# Patient Record
Sex: Female | Born: 1960 | Race: Black or African American | Hispanic: No | Marital: Single | Smoking: Current every day smoker
Health system: Southern US, Community
[De-identification: ages and names within clinical notes are randomized; demographics above are authoritative.]

---

## 1998-04-01 ENCOUNTER — Encounter: Payer: Self-pay | Admitting: *Deleted

## 1998-04-01 ENCOUNTER — Inpatient Hospital Stay (HOSPITAL_COMMUNITY): Admission: AD | Admit: 1998-04-01 | Discharge: 1998-04-01 | Payer: Self-pay | Admitting: *Deleted

## 2002-10-07 ENCOUNTER — Encounter: Payer: Self-pay | Admitting: Obstetrics & Gynecology

## 2002-10-07 ENCOUNTER — Encounter: Admission: RE | Admit: 2002-10-07 | Discharge: 2002-10-07 | Payer: Self-pay | Admitting: Obstetrics & Gynecology

## 2002-12-10 ENCOUNTER — Ambulatory Visit (HOSPITAL_COMMUNITY): Admission: RE | Admit: 2002-12-10 | Discharge: 2002-12-10 | Payer: Self-pay | Admitting: Obstetrics & Gynecology

## 2002-12-10 ENCOUNTER — Encounter: Payer: Self-pay | Admitting: Obstetrics & Gynecology

## 2008-08-28 ENCOUNTER — Emergency Department (HOSPITAL_COMMUNITY): Admission: EM | Admit: 2008-08-28 | Discharge: 2008-08-28 | Payer: Self-pay | Admitting: Emergency Medicine

## 2008-09-03 ENCOUNTER — Emergency Department (HOSPITAL_COMMUNITY): Admission: EM | Admit: 2008-09-03 | Discharge: 2008-09-03 | Payer: Self-pay | Admitting: Emergency Medicine

## 2010-07-17 ENCOUNTER — Encounter: Payer: Self-pay | Admitting: Obstetrics & Gynecology

## 2019-04-06 ENCOUNTER — Other Ambulatory Visit: Payer: Self-pay

## 2019-04-06 DIAGNOSIS — Z20822 Contact with and (suspected) exposure to covid-19: Secondary | ICD-10-CM

## 2019-04-07 LAB — NOVEL CORONAVIRUS, NAA: SARS-CoV-2, NAA: NOT DETECTED

## 2020-02-24 ENCOUNTER — Other Ambulatory Visit: Payer: Self-pay

## 2020-02-24 ENCOUNTER — Ambulatory Visit
Admission: EM | Admit: 2020-02-24 | Discharge: 2020-02-24 | Disposition: A | Discharge status: Home / Self Care | Payer: Self-pay | Attending: Emergency Medicine | Admitting: Emergency Medicine

## 2020-02-24 DIAGNOSIS — Z1152 Encounter for screening for COVID-19: Secondary | ICD-10-CM

## 2020-02-24 NOTE — Discharge Instructions (Signed)

## 2020-02-24 NOTE — ED Triage Notes (Signed)
Pt states had a covid exposure last week. Denies sx's

## 2020-02-27 LAB — NOVEL CORONAVIRUS, NAA: SARS-CoV-2, NAA: NOT DETECTED

## 2020-06-20 ENCOUNTER — Emergency Department (HOSPITAL_COMMUNITY): Payer: Self-pay

## 2020-06-20 ENCOUNTER — Emergency Department (HOSPITAL_COMMUNITY)
Admission: EM | Admit: 2020-06-20 | Discharge: 2020-06-21 | Disposition: A | Discharge status: Home / Self Care | Payer: Self-pay | Attending: Emergency Medicine | Admitting: Emergency Medicine

## 2020-06-20 ENCOUNTER — Other Ambulatory Visit: Payer: Self-pay

## 2020-06-20 DIAGNOSIS — M79662 Pain in left lower leg: Secondary | ICD-10-CM | POA: Diagnosis not present

## 2020-06-20 DIAGNOSIS — Z5321 Procedure and treatment not carried out due to patient leaving prior to being seen by health care provider: Secondary | ICD-10-CM | POA: Diagnosis not present

## 2020-06-20 DIAGNOSIS — M25551 Pain in right hip: Secondary | ICD-10-CM | POA: Diagnosis not present
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
## 2020-06-21 ENCOUNTER — Ambulatory Visit (HOSPITAL_COMMUNITY): Admit: 2020-06-21 | Discharge status: Against Medical Advice | Payer: Self-pay

## 2020-06-21 ENCOUNTER — Encounter: Payer: Self-pay | Admitting: Emergency Medicine

## 2020-06-21 ENCOUNTER — Ambulatory Visit
Admission: EM | Admit: 2020-06-21 | Discharge: 2020-06-21 | Disposition: A | Discharge status: Home / Self Care | Payer: No Typology Code available for payment source | Attending: Emergency Medicine | Admitting: Emergency Medicine

## 2020-06-21 DIAGNOSIS — M7918 Myalgia, other site: Secondary | ICD-10-CM

## 2020-06-21 MED ORDER — NAPROXEN 500 MG PO TABS: 500.0000 mg | ORAL_TABLET | Freq: Two times a day (BID) | ORAL | 0 refills | Status: DC

## 2020-06-21 MED ORDER — CYCLOBENZAPRINE HCL 5 MG PO TABS: 5.0000 mg | ORAL_TABLET | Freq: Two times a day (BID) | ORAL | 0 refills | Status: AC | PRN
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
## 2020-06-21 NOTE — ED Notes (Signed)
Pt checked out AMA. 

## 2020-08-21 ENCOUNTER — Ambulatory Visit
Admission: EM | Admit: 2020-08-21 | Discharge: 2020-08-21 | Disposition: A | Discharge status: Home / Self Care | Payer: Self-pay | Attending: Emergency Medicine | Admitting: Emergency Medicine

## 2020-08-21 ENCOUNTER — Encounter: Payer: Self-pay | Admitting: *Deleted

## 2020-08-21 ENCOUNTER — Other Ambulatory Visit: Payer: Self-pay

## 2020-08-21 DIAGNOSIS — R030 Elevated blood-pressure reading, without diagnosis of hypertension: Secondary | ICD-10-CM

## 2020-08-21 DIAGNOSIS — L089 Local infection of the skin and subcutaneous tissue, unspecified: Secondary | ICD-10-CM

## 2020-08-21 MED ORDER — TRIAMCINOLONE ACETONIDE 0.1 % EX CREA: 1.0000 "application " | TOPICAL_CREAM | Freq: Two times a day (BID) | CUTANEOUS | 0 refills | Status: AC

## 2020-08-21 MED ORDER — CEPHALEXIN 500 MG PO CAPS: 1000.0000 mg | ORAL_CAPSULE | Freq: Two times a day (BID) | ORAL | 0 refills | Status: AC

## 2020-08-21 MED ORDER — IBUPROFEN 600 MG PO TABS: 600.0000 mg | ORAL_TABLET | Freq: Four times a day (QID) | ORAL | 0 refills | Status: AC | PRN

## 2020-08-21 NOTE — ED Provider Notes (Addendum)
HPI  SUBJECTIVE:  Kendra Glover is a 60 y.o. female who presents with a painful, pruritic, swelling above her right lateral eyebrow for the past 3 days.  She denies trauma, known insect bite, facial rash, eye pain, pain with extraocular movements, visual changes, ear pain, other facial pain.  No fevers.  Reports mild diffuse headache.  No antipyretic in the past 6 hours.  She tried warm compresses with worsening of her symptoms.  No alleviating factors.  She has a past medical history of varicella.  No history of MRSA, abscess, diabetes, hypertension, chronic kidney disease.  PMD: None.  History reviewed. No pertinent past medical history.  History reviewed. No pertinent surgical history.  Family History  Problem Relation Age of Onset  . Hypertension Mother   . Hypertension Father   . Kidney disease Father     Social History   Tobacco Use  . Smoking status: Current Every Day Smoker  . Smokeless tobacco: Never Used  Vaping Use  . Vaping Use: Never used  Substance Use Topics  . Alcohol use: Yes    Comment: occasionally  . Drug use: Never    No current facility-administered medications for this encounter.  Current Outpatient Medications:  .  cephALEXin (KEFLEX) 500 MG capsule, Take 2 capsules (1,000 mg total) by mouth 2 (two) times daily for 5 days., Disp: 20 capsule, Rfl: 0 .  ibuprofen (ADVIL) 600 MG tablet, Take 1 tablet (600 mg total) by mouth every 6 (six) hours as needed., Disp: 30 tablet, Rfl: 0 .  triamcinolone (KENALOG) 0.1 %, Apply 1 application topically 2 (two) times daily. Apply for 2 weeks. May use on face, Disp: 30 g, Rfl: 0  No Known Allergies   ROS  As noted in HPI.   Physical Exam  BP (!) 174/103   Pulse 74   Temp (!) 97.3 F (36.3 C) (Temporal)   Resp 16   SpO2 98%    BP Readings from Last 3 Encounters:  08/21/20 (!) 174/103  06/21/20 (!) 168/79  06/21/20 (!) 163/84    Constitutional: Well developed, well nourished, no acute distress Eyes:   EOMI, conjunctiva normal bilaterally PERRLA, no direct or consensual photophobia HENT: Normocephalic, atraumatic,mucus membranes moist. positive 2 x 2 tender mobile mass superior right lateral eyebrow.  Questionable vesicles centrally.  No fluctuance.  No other facial, nose rash.  Right EAC normal.  No periorbital erythema, edema.  Right upper and lower eyelid normal       Respiratory: Normal inspiratory effort Cardiovascular: Normal rate GI: nondistended skin: No rash, skin intact Musculoskeletal: no deformities Neurologic: Alert & oriented x 3, no focal neuro deficits Psychiatric: Speech and behavior appropriate   ED Course   Medications - No data to display  No orders of the defined types were placed in this encounter.   No results found for this or any previous visit (from the past 24 hour(s)). No results found.  ED Clinical Impression  1. Facial infection   2. Elevated blood-pressure reading without diagnosis of hypertension      ED Assessment/Plan  In the differential is infected insect bite versus infected or inflamed gland since it is a mobile mass.  Early shingles is in the differential.  Will send home with Keflex for 5 days because it does not appear to be purulent, warm compress, Tylenol/ibuprofen, triamcinolone cream, Claritin or Zyrtec for the itching.  Return here if vesicular rash shows up.  Will provide primary care list and order assistance to help her  find of PMD.  Work note for 2 days.  Blood pressure noted.  It has been elevated on the past 3 visits.she does not take her blood pressure at home.  Has no idea what it runs normally.  Pt has no historical evidence of end organ damage. Pt denies any CNS type sx such as HA, visual changes, focal paresis, or new onset seizure activity. Pt denies any CV sx such as CP, dyspnea, palpitations, pedal edema, tearing pain radiating to back or abd. Pt denied any renal sx such as anuria or hematuria.   will have her buy a  blood pressure cuff, measure blood pressure once a day, keep a log of her blood pressure.  She will follow-up with a primary care provider in 2 weeks or return here with her blood pressure cuff, BP log and we can start her on medicine if necessary.  Discussed signs and symptoms of hypertensive emergency, complications of uncontrolled blood pressure.  Pt to f/u as OP.   Discussed  MDM, treatment plan, and plan for follow-up with patient. Discussed sn/sx that should prompt return to the ED. patient agrees with plan.   Meds ordered this encounter  Medications  . ibuprofen (ADVIL) 600 MG tablet    Sig: Take 1 tablet (600 mg total) by mouth every 6 (six) hours as needed.    Dispense:  30 tablet    Refill:  0  . cephALEXin (KEFLEX) 500 MG capsule    Sig: Take 2 capsules (1,000 mg total) by mouth 2 (two) times daily for 5 days.    Dispense:  20 capsule    Refill:  0  . triamcinolone (KENALOG) 0.1 %    Sig: Apply 1 application topically 2 (two) times daily. Apply for 2 weeks. May use on face    Dispense:  30 g    Refill:  0    *This clinic note was created using Scientist, clinical (histocompatibility and immunogenetics). Therefore, there may be occasional mistakes despite careful proofreading.   ?    Domenick Gong, MD 08/22/20 9811    Domenick Gong, MD 08/22/20 [phone]

## 2020-08-21 NOTE — ED Triage Notes (Signed)
Pt noticed swelling, tenderness, and pruritis to right lateral brow area onset 2 days ago, with progressive worsening.  Denies fevers.

## 2020-08-21 NOTE — Discharge Instructions (Signed)
Keflex is the antibiotic, warm compresses, 600 mg of ibuprofen combined with 1000 mg of Tylenol together 3-4 times a day as needed for pain.  You can try some Claritin or Zyrtec, and some triamcinolone cream for the itching.  Your blood pressure is high today.  Keep a close eye on it. Decrease your salt intake. diet and exercise will lower your blood pressure significantly. It is important to keep your blood pressure under good control, as having a elevated blood pressure for prolonged periods of time significantly increases your risk of stroke, heart attacks, kidney damage, eye damage, and other problems. Measure your blood pressure once a day, preferably at the same time every day. Keep a log of this and bring it to your next doctor's appointment.  Bring your blood pressure cuff as well.  Return here in 2 weeks for blood pressure recheck if you're unable to find a primary care physician by then. Return immediately to the ER if you start having chest pain, headache, problems seeing, problems talking, problems walking, if you feel like you're about to pass out, if you do pass out, if you have a seizure, or for any other concerns.  Below is a list of primary care practices who are taking new patients for you to follow-up with.  [facility] Ground Floor - [facility], [address] [place], [place], [place] [address] [phone]  [facility] [address] [place], [place] [address] [phone]  [facility] [address] [place], [place] [address] [phone]  [facility] [phone] [address] [place] [address]  [facility]: [address] [place] [address]  [phone]  [facility]: [address] [place] [address]   [phone]  [facility]:  [address] [place] [address]  [phone]  [facility] : [address] [place] [address] [phone]  [facility]: [address] [place] [address] [phone]  [facility]: [address] [place] [address] [phone]  Dr. Oneal Grout [address] [facility] [place] [address]  [phone]   Go to www.goodrx.com to look up your medications. This will give you a list of where you can find your prescriptions at the most affordable prices. Or ask the pharmacist what the cash price is, or if they have any other discount programs available to help make your medication more affordable. This can be less expensive than what you would pay with insurance.
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
## 2022-10-12 ENCOUNTER — Ambulatory Visit
Admission: EM | Admit: 2022-10-12 | Discharge: 2022-10-12 | Disposition: A | Discharge status: Home / Self Care | Payer: Self-pay | Attending: Internal Medicine | Admitting: Internal Medicine

## 2022-10-12 ENCOUNTER — Ambulatory Visit (INDEPENDENT_AMBULATORY_CARE_PROVIDER_SITE_OTHER): Payer: Self-pay

## 2022-10-12 ENCOUNTER — Ambulatory Visit (HOSPITAL_COMMUNITY)
Admission: EM | Admit: 2022-10-12 | Discharge: 2022-10-12 | Disposition: A | Discharge status: Home / Self Care | Payer: Self-pay | Attending: Emergency Medicine | Admitting: Emergency Medicine

## 2022-10-12 ENCOUNTER — Other Ambulatory Visit: Payer: Self-pay

## 2022-10-12 DIAGNOSIS — M79645 Pain in left finger(s): Secondary | ICD-10-CM

## 2022-10-12 NOTE — Discharge Instructions (Signed)
X-ray did not show any obvious abnormalities causing your pain.  Suspect some form of tendinitis.  A splint has been applied to help with support and stability.  Take ibuprofen as needed.  Follow-up with hand specialty.

## 2022-10-12 NOTE — ED Triage Notes (Signed)
Pt reports right thumb pain and swelling x 1-2 weeks. Pt reports no injury to her finger. Pt reports she feels sharp pain in her finger.  Pt denies taking any medication for pain. Pt reports she works in a [employer].

## 2022-10-12 NOTE — ED Provider Notes (Signed)
[facility]    CSN: [id number] Arrival date & time: 10/12/22  1141      History   Chief Complaint Chief Complaint  Patient presents with   Finger Injury    Can't bend thumb - Entered by patient    HPI Kendra Glover is a 62 y.o. female.   Patient presents with left thumb pain that started about 1 to 2 months ago.  Patient denies any obvious injury to the area.  Denies numbness or tingling.  Patient reports that she has taken ibuprofen intermittently but has not taken any in "a while".  Reports minimal range of motion of thumb due to pain.  Denies history of chronic thumb pain or any prior injuries.     History reviewed. No pertinent past medical history.  There are no problems to display for this patient.   History reviewed. No pertinent surgical history.  OB History   No obstetric history on file.      Home Medications    Prior to Admission medications   Medication Sig Start Date End Date Taking? Authorizing Provider  ibuprofen (ADVIL) 600 MG tablet Take 1 tablet (600 mg total) by mouth every 6 (six) hours as needed. 08/21/20   Domenick Gong, MD  triamcinolone (KENALOG) 0.1 % Apply 1 application topically 2 (two) times daily. Apply for 2 weeks. May use on face 08/21/20   Domenick Gong, MD    Family History Family History  Problem Relation Age of Onset   Hypertension Mother    Hypertension Father    Kidney disease Father     Social History Social History   Tobacco Use   Smoking status: Every Day   Smokeless tobacco: Never  Vaping Use   Vaping Use: Never used  Substance Use Topics   Alcohol use: Yes    Comment: occasionally   Drug use: Never     Allergies   Patient has no known allergies.   Review of Systems Review of Systems Per HPI  Physical Exam Triage Vital Signs ED Triage Vitals [10/12/22 1153]  Enc Vitals Group     BP (!) 143/86     Pulse Rate 82     Resp 16     Temp 98.7 F (37.1 C)     Temp Source Oral      SpO2 98 %     Weight      Height      Head Circumference      Peak Flow      Pain Score      Pain Loc      Pain Edu?      Excl. in GC?    No data found.  Updated Vital Signs BP (!) 143/86 (BP Location: Left Arm)   Pulse 92   Temp 98.7 F (37.1 C) (Oral)   Resp 16   SpO2 98%   Visual Acuity Right Eye Distance:   Left Eye Distance:   Bilateral Distance:    Right Eye Near:   Left Eye Near:    Bilateral Near:     Physical Exam Constitutional:      General: She is not in acute distress.    Appearance: Normal appearance. She is not toxic-appearing or diaphoretic.  HENT:     Head: Normocephalic and atraumatic.  Eyes:     Extraocular Movements: Extraocular movements intact.     Conjunctiva/sclera: Conjunctivae normal.  Pulmonary:     Effort: Pulmonary effort is normal.  Musculoskeletal:  Comments: Tenderness to palpation to MCP joint of left thumb.  Limited flexion of thumb given pain but patient can fully extend.  Capillary refill and pulses are intact.  No swelling or discoloration noted.  No lacerations or abrasions noted.  Neurological:     General: No focal deficit present.     Mental Status: She is alert and oriented to person, place, and time. Mental status is at baseline.  Psychiatric:        Mood and Affect: Mood normal.        Behavior: Behavior normal.        Thought Content: Thought content normal.        Judgment: Judgment normal.      UC Treatments / Results  Labs (all labs ordered are listed, but only abnormal results are displayed) Labs Reviewed - No data to display  EKG   Radiology DG Finger Thumb Left  Result Date: 10/12/2022 CLINICAL DATA:  Left thumb pain 1-2 weeks.  No injury. EXAM: LEFT THUMB 2+V COMPARISON:  None Available. FINDINGS: Mild degenerate change over the first carpometacarpal joint. No evidence of acute fracture or dislocation. No bony erosions. Bone alignment and mineralization is normal. IMPRESSION: 1. No acute findings.  2. Mild degenerative change of the first carpometacarpal joint. Electronically Signed   By: Elberta Fortis M.D.   On: 10/12/2022 12:30    Procedures Procedures (including critical care time)  Medications Ordered in UC Medications - No data to display  Initial Impression / Assessment and Plan / UC Course  I have reviewed the triage vital signs and the nursing notes.  Pertinent labs & imaging results that were available during my care of the patient were reviewed by me and considered in my medical decision making (see chart for details).     Left thumb x-ray is showing some degenerative changes of the [facility] joint of the left thumb but no other obvious acute bony abnormality.  Given that tenderness is not overlying degenerative changes, I am suspicious that patient may have some form of tendinitis.  Patient is neurovascularly intact which is reassuring.  She does have some limited range of motion but given duration of time since symptoms started, I do not think that emergent evaluation by orthopedic specialist is necessary.  I do think that patient needs to see an orthopedist for follow-up as soon as possible though.  Therefore, patient was provided with contact information for [facility] and advised to call today or Monday to schedule an appointment for follow-up. Do not have thumb spica splint here in this urgent care so patient was advised to go over to our [address] urgent care location to have splint placed. She was agreeable to this. Advised supportive care and NSAIDs as needed.  She does not take any daily medications and no history of kidney problems so NSAIDs should be safe. Patient verbalized understanding and was agreeable with plan. Final Clinical Impressions(s) / UC Diagnoses   Final diagnoses:  Pain of left thumb     Discharge Instructions      X-ray did not show any obvious abnormalities causing your pain.  Suspect some form of tendinitis.  A splint has been applied to help  with support and stability.  Take ibuprofen as needed.  Follow-up with hand specialty.     ED Prescriptions   None    PDMP not reviewed this encounter.   Gustavus Bryant, [place] 10/12/22 1247

## 2023-04-25 ENCOUNTER — Ambulatory Visit
Admission: EM | Admit: 2023-04-25 | Discharge: 2023-04-25 | Disposition: A | Discharge status: Home / Self Care | Payer: No Typology Code available for payment source | Attending: Physician Assistant | Admitting: Physician Assistant

## 2023-04-25 DIAGNOSIS — H00011 Hordeolum externum right upper eyelid: Secondary | ICD-10-CM | POA: Diagnosis not present

## 2023-04-25 MED ORDER — ERYTHROMYCIN 5 MG/GM OP OINT: TOPICAL_OINTMENT | OPHTHALMIC | 0 refills | Status: AC

## 2023-04-25 NOTE — ED Provider Notes (Signed)
[facility]    CSN: [id number] Arrival date & time: 04/25/23  1400      History   Chief Complaint Chief Complaint  Patient presents with   Facial Swelling    HPI Kendra Glover is a 62 y.o. female.   Patient here today for evaluation of swelling to her right upper lid that is been present for the last month.  She reports she did think she had drainage from same at one point.  She reports mild pain associated.  She denies any vision changes other than being able to visualize area of swelling.  The history is provided by the patient.    History reviewed. No pertinent past medical history.  There are no problems to display for this patient.   History reviewed. No pertinent surgical history.  OB History   No obstetric history on file.      Home Medications    Prior to Admission medications   Medication Sig Start Date End Date Taking? Authorizing Provider  erythromycin ophthalmic ointment Place a 1/2 inch ribbon of ointment into the lower eyelid. 04/25/23  Yes Tomi Bamberger, PA-C  ibuprofen (ADVIL) 600 MG tablet Take 1 tablet (600 mg total) by mouth every 6 (six) hours as needed. 08/21/20   Domenick Gong, MD  triamcinolone (KENALOG) 0.1 % Apply 1 application topically 2 (two) times daily. Apply for 2 weeks. May use on face 08/21/20   Domenick Gong, MD    Family History Family History  Problem Relation Age of Onset   Hypertension Mother    Hypertension Father    Kidney disease Father     Social History Social History   Tobacco Use   Smoking status: Every Day   Smokeless tobacco: Never  Vaping Use   Vaping status: Never Used  Substance Use Topics   Alcohol use: Yes    Comment: occasionally   Drug use: Never     Allergies   Patient has no known allergies.   Review of Systems Review of Systems  Constitutional:  Negative for chills and fever.  Eyes:  Negative for photophobia, discharge and redness.  Gastrointestinal:  Negative  for nausea and vomiting.     Physical Exam Triage Vital Signs ED Triage Vitals  Encounter Vitals Group     BP 04/25/23 1436 (!) 170/92     Systolic BP Percentile --      Diastolic BP Percentile --      Pulse Rate 04/25/23 1436 70     Resp 04/25/23 1436 16     Temp 04/25/23 1436 98 F (36.7 C)     Temp Source 04/25/23 1436 Oral     SpO2 04/25/23 1436 98 %     Weight --      Height --      Head Circumference --      Peak Flow --      Pain Score 04/25/23 1437 0     Pain Loc --      Pain Education --      Exclude from Growth Chart --    No data found.  Updated Vital Signs BP (!) 170/92 (BP Location: Left Arm)   Pulse 70   Temp 98 F (36.7 C) (Oral)   Resp 16   SpO2 98%      Physical Exam Vitals and nursing note reviewed.  Constitutional:      General: She is not in acute distress.    Appearance: Normal appearance. She is not ill-appearing.  HENT:     Head: Normocephalic and atraumatic.  Eyes:     Conjunctiva/sclera: Conjunctivae normal.     Comments: Localized area of swelling to lateral right upper lid  Cardiovascular:     Rate and Rhythm: Normal rate.  Pulmonary:     Effort: Pulmonary effort is normal.  Neurological:     Mental Status: She is alert.  Psychiatric:        Mood and Affect: Mood normal.        Behavior: Behavior normal.        Thought Content: Thought content normal.      UC Treatments / Results  Labs (all labs ordered are listed, but only abnormal results are displayed) Labs Reviewed - No data to display  EKG   Radiology No results found.  Procedures Procedures (including critical care time)  Medications Ordered in UC Medications - No data to display  Initial Impression / Assessment and Plan / UC Course  I have reviewed the triage vital signs and the nursing notes.  Pertinent labs & imaging results that were available during my care of the patient were reviewed by me and considered in my medical decision making (see chart  for details).    Presentation consistent with stye.  Will treat with antibiotic ointment and recommended follow-up with ophthalmology if no improvement with warm compresses given duration of symptoms.  Discussed she may need incision and drainage by specialist.  Patient expressed understanding.  Final Clinical Impressions(s) / UC Diagnoses   Final diagnoses:  Hordeolum externum of right upper eyelid   Discharge Instructions   None    ED Prescriptions     Medication Sig Dispense Auth. Provider   erythromycin ophthalmic ointment Place a 1/2 inch ribbon of ointment into the lower eyelid. 3.5 g Tomi Bamberger, PA-C      PDMP not reviewed this encounter.   Tomi Bamberger, PA-C 04/25/23 [phone]

## 2023-04-25 NOTE — ED Triage Notes (Signed)
Pt states swelling to her right upper eye lid for one month.

## 2023-08-01 ENCOUNTER — Encounter: Payer: Self-pay | Admitting: Emergency Medicine

## 2023-08-01 ENCOUNTER — Ambulatory Visit
Admission: EM | Admit: 2023-08-01 | Discharge: 2023-08-01 | Disposition: A | Discharge status: Home / Self Care | Payer: No Typology Code available for payment source | Attending: Physician Assistant | Admitting: Physician Assistant

## 2023-08-01 DIAGNOSIS — J069 Acute upper respiratory infection, unspecified: Secondary | ICD-10-CM | POA: Diagnosis present

## 2023-08-01 LAB — POCT INFLUENZA A/B
Influenza A, POC: NEGATIVE
Influenza B, POC: NEGATIVE

## 2023-08-01 NOTE — ED Provider Notes (Signed)
 [facility]    CSN: [id number] Arrival date & time: 08/01/23  1147      History   Chief Complaint Chief Complaint  Patient presents with   Fever   Nasal Congestion   Generalized Body Aches    HPI Kendra Glover is a 63 y.o. female.   Patient here today for evaluation of fever, nasal and chest congestion with bodyaches that started 4 days ago.  She reports that she has been exposed to COVID and would like screening for same.  The history is provided by the patient.  Fever Associated symptoms: congestion, cough and myalgias   Associated symptoms: no chills, no diarrhea, no ear pain, no nausea, no sore throat and no vomiting     History reviewed. No pertinent past medical history.  There are no active problems to display for this patient.   History reviewed. No pertinent surgical history.  OB History   No obstetric history on file.      Home Medications    Prior to Admission medications   Medication Sig Start Date End Date Taking? Authorizing Provider  erythromycin  ophthalmic ointment Place a 1/2 inch ribbon of ointment into the lower eyelid. 04/25/23  Yes Billy Asberry FALCON, PA-C  ibuprofen  (ADVIL ) 600 MG tablet Take 1 tablet (600 mg total) by mouth every 6 (six) hours as needed. 08/21/20  Yes Van Knee, MD  triamcinolone  (KENALOG ) 0.1 % Apply 1 application topically 2 (two) times daily. Apply for 2 weeks. May use on face Patient not taking: Reported on 08/01/2023 08/21/20   Van Knee, MD    Family History Family History  Problem Relation Age of Onset   Hypertension Mother    Hypertension Father    Kidney disease Father     Social History Social History   Tobacco Use   Smoking status: Every Day    Types: Cigarettes    Passive exposure: Current   Smokeless tobacco: Never  Vaping Use   Vaping status: Never Used  Substance Use Topics   Alcohol use: Yes    Comment: occasionally   Drug use: Never     Allergies   Patient has  no known allergies.   Review of Systems Review of Systems  Constitutional:  Positive for fever. Negative for chills.  HENT:  Positive for congestion. Negative for ear pain and sore throat.   Eyes:  Negative for discharge and redness.  Respiratory:  Positive for cough. Negative for shortness of breath and wheezing.   Gastrointestinal:  Negative for abdominal pain, diarrhea, nausea and vomiting.  Musculoskeletal:  Positive for myalgias.     Physical Exam Triage Vital Signs ED Triage Vitals [08/01/23 1222]  Encounter Vitals Group     BP (!) 163/91     Systolic BP Percentile      Diastolic BP Percentile      Pulse Rate 71     Resp 18     Temp 98.1 F (36.7 C)     Temp Source Oral     SpO2 99 %     Weight      Height      Head Circumference      Peak Flow      Pain Score 0     Pain Loc      Pain Education      Exclude from Growth Chart    No data found.  Updated Vital Signs BP (!) 163/91 (BP Location: Left Arm)   Pulse 71   Temp  98.1 F (36.7 C) (Oral)   Resp 18   SpO2 99%   Visual Acuity Right Eye Distance:   Left Eye Distance:   Bilateral Distance:    Right Eye Near:   Left Eye Near:    Bilateral Near:     Physical Exam Vitals and nursing note reviewed.  Constitutional:      General: She is not in acute distress.    Appearance: Normal appearance. She is not ill-appearing.  HENT:     Head: Normocephalic and atraumatic.     Nose: Congestion present.     Mouth/Throat:     Mouth: Mucous membranes are moist.     Pharynx: No oropharyngeal exudate or posterior oropharyngeal erythema.  Eyes:     Conjunctiva/sclera: Conjunctivae normal.  Cardiovascular:     Rate and Rhythm: Normal rate and regular rhythm.     Heart sounds: Normal heart sounds. No murmur heard. Pulmonary:     Effort: Pulmonary effort is normal. No respiratory distress.     Breath sounds: Normal breath sounds. No wheezing, rhonchi or rales.  Skin:    General: Skin is warm and dry.   Neurological:     Mental Status: She is alert.  Psychiatric:        Mood and Affect: Mood normal.        Thought Content: Thought content normal.      UC Treatments / Results  Labs (all labs ordered are listed, but only abnormal results are displayed) Labs Reviewed  POCT INFLUENZA A/B - Normal  SARS CORONAVIRUS 2 (TAT 6-24 HRS)    EKG   Radiology No results found.  Procedures Procedures (including critical care time)  Medications Ordered in UC Medications - No data to display  Initial Impression / Assessment and Plan / UC Course  I have reviewed the triage vital signs and the nursing notes.  Pertinent labs & imaging results that were available during my care of the patient were reviewed by me and considered in my medical decision making (see chart for details).    Flu screening negative.  Will order COVID screening.  Will await results further recommendation but encourage symptomatic treatment, increase with the rest with follow-up if no gradual improvement with any further concerns.  Final Clinical Impressions(s) / UC Diagnoses   Final diagnoses:  Viral upper respiratory tract infection   Discharge Instructions   None    ED Prescriptions   None    PDMP not reviewed this encounter.   Billy Asberry FALCON, PA-C 08/01/23 1349

## 2023-08-01 NOTE — ED Triage Notes (Signed)
 Pt reports fevers, head/nasal/chest congestion, body aches, and cough x4 days. Exposed to covid and would like covid testing.

## 2023-08-02 LAB — SARS CORONAVIRUS 2 (TAT 6-24 HRS): SARS Coronavirus 2: POSITIVE — AB
